# Patient Record
Sex: Female | Born: 2005 | Hispanic: Yes | Marital: Single | Smoking: Never smoker
Health system: Southern US, Community
[De-identification: ages and names within clinical notes are randomized; demographics above are authoritative.]

## PROBLEM LIST (undated history)

## (undated) DIAGNOSIS — R7303 Prediabetes: Secondary | ICD-10-CM

## (undated) DIAGNOSIS — E785 Hyperlipidemia, unspecified: Secondary | ICD-10-CM

## (undated) DIAGNOSIS — T7840XA Allergy, unspecified, initial encounter: Secondary | ICD-10-CM

## (undated) DIAGNOSIS — H539 Unspecified visual disturbance: Secondary | ICD-10-CM

## (undated) DIAGNOSIS — R7989 Other specified abnormal findings of blood chemistry: Secondary | ICD-10-CM

## (undated) HISTORY — DX: Allergy, unspecified, initial encounter: T78.40XA

## (undated) HISTORY — DX: Hyperlipidemia, unspecified: E78.5

## (undated) HISTORY — DX: Prediabetes: R73.03

## (undated) HISTORY — DX: Other specified abnormal findings of blood chemistry: R79.89

## (undated) HISTORY — DX: Unspecified visual disturbance: H53.9

---
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
## 2024-01-26 DIAGNOSIS — Z7187 Encounter for pediatric-to-adult transition counseling: Secondary | ICD-10-CM | POA: Insufficient documentation

## 2024-01-26 NOTE — Progress Notes (Deleted)
 Pediatric Endocrinology Consultation Follow-up Visit Nichole Alvarado [birth year] [id number], [facility]   HPI: Nichole Alvarado  is a 18 y.o. female presenting for follow-up of {Diagnosis:29534}.  she is accompanied to this visit by her {family members:20773}. {Interpreter present throughout the visit:29436::"No"}.  Nichole Alvarado was last seen at [facility] on 10/13/2023.  Since last visit, gynecology reached out to schedule appointment, but did not return call, so referral was closed. Appt to family medicine was not completed either.   ROS: Greater than 10 systems reviewed with pertinent positives listed in HPI, otherwise neg. The following portions of the patient's history were reviewed and updated as appropriate:  Past Medical History:  has a past medical history of Allergy, Hyperlipidemia, Low vitamin D level, Prediabetes, Prediabetes, and Vision abnormalities.  Meds: Current Outpatient Medications  Medication Instructions   cetirizine  (ZYRTEC ) 10 mg, Oral, Daily   metFORMIN  (GLUCOPHAGE -XR) 500 mg, Oral, Daily with supper   VITAMIN D PO 1 capsule, Daily    Allergies: Allergies  Allergen Reactions   Bee Pollen Itching    Runny nose, itchy eyes    Surgical History: No past surgical history on file.  Family History: family history includes Diabetes in her maternal grandmother; Drug abuse in her brother; GER disease in her mother; Hypertension in her maternal grandmother and mother.  Social History: Social History   Social History Narrative   In 12th grade at [employer] 24-25 school year      Lives mom, dad, brother, and sister. No pets   Likes to draw     reports that she has never smoked. She has never been exposed to tobacco smoke. She has never used smokeless tobacco. She reports that she does not drink alcohol.  Physical Exam:  There were no vitals filed for this visit. There were no vitals taken for this visit. Body mass index: body mass index is unknown  because there is no height or weight on file. Blood pressure %iles are not available for patients who are 18 years or older. No height and weight on file for this encounter.  Wt Readings from Last 3 Encounters:  10/13/23 198 lb (89.8 kg) (97%, Z= 1.94)*  07/29/23 199 lb (90.3 kg) (98%, Z= 1.96)*  01/28/23 (!) 205 lb 12.8 oz (93.4 kg) (98%, Z= 2.06)*   * Growth percentiles are based on CDC (Girls, 2-20 Years) data.   Ht Readings from Last 3 Encounters:  10/13/23 5' 5.91" (1.674 m) (74%, Z= 0.65)*  07/29/23 5' 5.87" (1.673 m) (74%, Z= 0.64)*  01/28/23 5' 6.3" (1.684 m) (79%, Z= 0.82)*   * Growth percentiles are based on CDC (Girls, 2-20 Years) data.   Physical Exam   Labs: Results for orders placed or performed in visit on 07/29/23  T4, free   Collection Time: 07/29/23 12:51 PM  Result Value Ref Range   Free T4 1.3 0.8 - 1.4 ng/dL  TSH   Collection Time: 07/29/23 12:51 PM  Result Value Ref Range   TSH 1.79 mIU/L  CBC with Differential/Platelet   Collection Time: 07/29/23 12:51 PM  Result Value Ref Range   WBC 6.2 4.5 - 13.0 Thousand/uL   RBC 4.39 3.80 - 5.10 Million/uL   Hemoglobin 12.7 11.5 - 15.3 g/dL   HCT 62.1 30.8 - 65.7 %   MCV 90.0 78.0 - 98.0 fL   MCH 28.9 25.0 - 35.0 pg   MCHC 32.2 31.0 - 36.0 g/dL   RDW 84.6 96.2 - 95.2 %   Platelets 183  140 - 400 Thousand/uL   MPV 12.6 (H) 7.5 - 12.5 fL   Neutro Abs 3,422 1,800 - 8,000 cells/uL   Absolute Lymphocytes 2,294 1,200 - 5,200 cells/uL   Absolute Monocytes 428 200 - 900 cells/uL   Eosinophils Absolute 37 15 - 500 cells/uL   Basophils Absolute 19 0 - 200 cells/uL   Neutrophils Relative % 55.2 %   Total Lymphocyte 37.0 %   Monocytes Relative 6.9 %   Eosinophils Relative 0.6 %   Basophils Relative 0.3 %  Fe+TIBC+Fer   Collection Time: 07/29/23 12:51 PM  Result Value Ref Range   Iron 71 27 - 164 mcg/dL   TIBC 161 096 - 045 mcg/dL (calc)   %SAT 20 15 - 45 % (calc)   Ferritin 28 6 - 67 ng/mL  DHEA-sulfate    Collection Time: 07/29/23 12:51 PM  Result Value Ref Range   DHEA-SO4 175 44 - 286 mcg/dL  Testosterone , free   Collection Time: 07/29/23 12:51 PM  Result Value Ref Range   TESTOSTERONE  FREE 10.2 (H) 0.2 - 5.0 pg/mL  [facility], Pediatrics   Collection Time: 07/29/23 12:51 PM  Result Value Ref Range   FSH, Pediatrics 5.10 mIU/mL  LH, Pediatrics   Collection Time: 07/29/23 12:51 PM  Result Value Ref Range   LH, Pediatrics 7.12 mIU/mL  Estradiol , Ultra Sens   Collection Time: 07/29/23 12:51 PM  Result Value Ref Range   Estradiol , Ultra Sensitive 52 pg/mL  Comprehensive metabolic panel   Collection Time: 07/29/23 12:51 PM  Result Value Ref Range   Glucose, Bld 77 65 - 99 mg/dL   BUN 10 7 - 20 mg/dL   Creat 4.09 8.11 - 9.14 mg/dL   BUN/Creatinine Ratio SEE NOTE: 6 - 22 (calc)   Sodium 137 135 - 146 mmol/L   Potassium 4.3 3.8 - 5.1 mmol/L   Chloride 103 98 - 110 mmol/L   CO2 27 20 - 32 mmol/L   Calcium 9.1 8.9 - 10.4 mg/dL   Total Protein 7.2 6.3 - 8.2 g/dL   Albumin 4.1 3.6 - 5.1 g/dL   Globulin 3.1 2.0 - 3.8 g/dL (calc)   AG Ratio 1.3 1.0 - 2.5 (calc)   Total Bilirubin 0.5 0.2 - 1.1 mg/dL   Alkaline phosphatase (APISO) 75 36 - 128 U/L   AST 16 12 - 32 U/L   ALT 21 5 - 32 U/L  Lipid panel   Collection Time: 07/29/23 12:51 PM  Result Value Ref Range   Cholesterol 157 <170 mg/dL   HDL 45 (L) >78 mg/dL   Triglycerides 295 (H) <90 mg/dL   LDL Cholesterol (Calc) 87 <621 mg/dL (calc)   Total CHOL/HDL Ratio 3.5 <5.0 (calc)   Non-HDL Cholesterol (Calc) 112 <120 mg/dL (calc)  Hemoglobin H0Q   Collection Time: 07/29/23 12:51 PM  Result Value Ref Range   Hgb A1c MFr Bld 5.6 <5.7 % of total Hgb   Mean Plasma Glucose 114 mg/dL   eAG (mmol/L) 6.3 mmol/L    Imaging: No results found for this or any previous visit.   Assessment/Plan: PCOS (polycystic ovarian syndrome) Overview: History of metabolic syndrome and oligomenorrhea with menometrorrhagia and hirsutism on exam. Screening  studies confirmed diagnosis of PCOS as free testosterone  was elevated at 10.    Thyroid  nodule Overview: Per Dr. Erasmo Hatch documentation: - Ultrasound with 1.5 cm nodule in March 2023 - Uptake scan did not show nodule in May 2023 - Thyroid  US  showed only pseudonodularity in 2024 - Biopsy ordered but  not scheduled/cancelled April 2024 thyroid  ultrasound: per radiologist, Nonspecific thyroid  heterogeneity and background pseudo nodularity. Thyroid  abs neg 2023     There are no Patient Instructions on file for this visit.  Follow-up:   No follow-ups on file.  Medical decision-making:  I have personally spent *** minutes involved in face-to-face and non-face-to-face activities for this patient on the day of the visit. Professional time spent includes the following activities, in addition to those noted in the documentation: preparation time/chart review, ordering of medications/tests/procedures, obtaining and/or reviewing separately obtained history, counseling and educating the patient/family/caregiver, performing a medically appropriate examination and/or evaluation, referring and communicating with other health care professionals for care coordination, my interpretation of the bone age***, and documentation in the EHR.  Thank you for the opportunity to participate in the care of your patient. Please do not hesitate to contact me should you have any questions regarding the assessment or treatment plan.   Sincerely,   Maryjo Snipe, MD

## 2024-01-27 ENCOUNTER — Ambulatory Visit (INDEPENDENT_AMBULATORY_CARE_PROVIDER_SITE_OTHER): Payer: Self-pay | Admitting: Pediatrics

## 2024-01-27 ENCOUNTER — Encounter (INDEPENDENT_AMBULATORY_CARE_PROVIDER_SITE_OTHER): Payer: Self-pay

## 2024-01-27 DIAGNOSIS — E041 Nontoxic single thyroid nodule: Secondary | ICD-10-CM

## 2024-01-27 DIAGNOSIS — E282 Polycystic ovarian syndrome: Secondary | ICD-10-CM

## 2024-01-27 DIAGNOSIS — Z7187 Encounter for pediatric-to-adult transition counseling: Secondary | ICD-10-CM

## 2024-02-16 NOTE — Progress Notes (Deleted)
 Pediatric Endocrinology Consultation Follow-up Visit Nichole Alvarado [birth year] [id number], [facility]   HPI: Nichole Alvarado  is a 18 y.o. female presenting for follow-up of {Diagnosis:29534}.  she is accompanied to this visit by her {family members:20773}. {Interpreter present throughout the visit:29436::No}.  Nichole Alvarado was last seen at [facility] on 10/13/2023.  Since last visit, gynecology called to schedule appointment and LVM with no response, so referral was closed.  ROS: Greater than 10 systems reviewed with pertinent positives listed in HPI, otherwise neg. The following portions of the patient's history were reviewed and updated as appropriate:  Past Medical History:  has a past medical history of Allergy, Hyperlipidemia, Low vitamin D level, Prediabetes, Prediabetes, and Vision abnormalities.  Meds: Current Outpatient Medications  Medication Instructions   cetirizine  (ZYRTEC ) 10 mg, Oral, Daily   metFORMIN  (GLUCOPHAGE -XR) 500 mg, Oral, Daily with supper   VITAMIN D PO 1 capsule, Daily    Allergies: Allergies  Allergen Reactions   Bee Pollen Itching    Runny nose, itchy eyes    Surgical History: No past surgical history on file.  Family History: family history includes Diabetes in her maternal grandmother; Drug abuse in her brother; GER disease in her mother; Hypertension in her maternal grandmother and mother.  Social History: Social History   Social History Narrative   In 12th grade at [employer] 24-25 school year      Lives mom, dad, brother, and sister. No pets   Likes to draw     reports that she has never smoked. She has never been exposed to tobacco smoke. She has never used smokeless tobacco. She reports that she does not drink alcohol.  Physical Exam:  There were no vitals filed for this visit. There were no vitals taken for this visit. Body mass index: body mass index is unknown because there is no height or weight on file. Blood  pressure %iles are not available for patients who are 18 years or older. No height and weight on file for this encounter.  Wt Readings from Last 3 Encounters:  10/13/23 198 lb (89.8 kg) (97%, Z= 1.94)*  07/29/23 199 lb (90.3 kg) (98%, Z= 1.96)*  01/28/23 (!) 205 lb 12.8 oz (93.4 kg) (98%, Z= 2.06)*   * Growth percentiles are based on CDC (Girls, 2-20 Years) data.   Ht Readings from Last 3 Encounters:  10/13/23 5' 5.91 (1.674 m) (74%, Z= 0.65)*  07/29/23 5' 5.87 (1.673 m) (74%, Z= 0.64)*  01/28/23 5' 6.3 (1.684 m) (79%, Z= 0.82)*   * Growth percentiles are based on CDC (Girls, 2-20 Years) data.   Physical Exam   Labs: Results for orders placed or performed in visit on 07/29/23  T4, free   Collection Time: 07/29/23 12:51 PM  Result Value Ref Range   Free T4 1.3 0.8 - 1.4 ng/dL  TSH   Collection Time: 07/29/23 12:51 PM  Result Value Ref Range   TSH 1.79 mIU/L  CBC with Differential/Platelet   Collection Time: 07/29/23 12:51 PM  Result Value Ref Range   WBC 6.2 4.5 - 13.0 Thousand/uL   RBC 4.39 3.80 - 5.10 Million/uL   Hemoglobin 12.7 11.5 - 15.3 g/dL   HCT 60.4 65.9 - 53.9 %   MCV 90.0 78.0 - 98.0 fL   MCH 28.9 25.0 - 35.0 pg   MCHC 32.2 31.0 - 36.0 g/dL   RDW 87.6 88.9 - 84.9 %   Platelets 183 140 - 400 Thousand/uL   MPV 12.6 (H) 7.5 -  12.5 fL   Neutro Abs 3,422 1,800 - 8,000 cells/uL   Absolute Lymphocytes 2,294 1,200 - 5,200 cells/uL   Absolute Monocytes 428 200 - 900 cells/uL   Eosinophils Absolute 37 15 - 500 cells/uL   Basophils Absolute 19 0 - 200 cells/uL   Neutrophils Relative % 55.2 %   Total Lymphocyte 37.0 %   Monocytes Relative 6.9 %   Eosinophils Relative 0.6 %   Basophils Relative 0.3 %  Fe+TIBC+Fer   Collection Time: 07/29/23 12:51 PM  Result Value Ref Range   Iron 71 27 - 164 mcg/dL   TIBC 650 728 - 551 mcg/dL (calc)   %SAT 20 15 - 45 % (calc)   Ferritin 28 6 - 67 ng/mL  DHEA-sulfate   Collection Time: 07/29/23 12:51 PM  Result Value Ref  Range   DHEA-SO4 175 44 - 286 mcg/dL  Testosterone , free   Collection Time: 07/29/23 12:51 PM  Result Value Ref Range   TESTOSTERONE  FREE 10.2 (H) 0.2 - 5.0 pg/mL  [facility], Pediatrics   Collection Time: 07/29/23 12:51 PM  Result Value Ref Range   FSH, Pediatrics 5.10 mIU/mL  LH, Pediatrics   Collection Time: 07/29/23 12:51 PM  Result Value Ref Range   LH, Pediatrics 7.12 mIU/mL  Estradiol , Ultra Sens   Collection Time: 07/29/23 12:51 PM  Result Value Ref Range   Estradiol , Ultra Sensitive 52 pg/mL  Comprehensive metabolic panel   Collection Time: 07/29/23 12:51 PM  Result Value Ref Range   Glucose, Bld 77 65 - 99 mg/dL   BUN 10 7 - 20 mg/dL   Creat 9.35 9.49 - 9.03 mg/dL   BUN/Creatinine Ratio SEE NOTE: 6 - 22 (calc)   Sodium 137 135 - 146 mmol/L   Potassium 4.3 3.8 - 5.1 mmol/L   Chloride 103 98 - 110 mmol/L   CO2 27 20 - 32 mmol/L   Calcium 9.1 8.9 - 10.4 mg/dL   Total Protein 7.2 6.3 - 8.2 g/dL   Albumin 4.1 3.6 - 5.1 g/dL   Globulin 3.1 2.0 - 3.8 g/dL (calc)   AG Ratio 1.3 1.0 - 2.5 (calc)   Total Bilirubin 0.5 0.2 - 1.1 mg/dL   Alkaline phosphatase (APISO) 75 36 - 128 U/L   AST 16 12 - 32 U/L   ALT 21 5 - 32 U/L  Lipid panel   Collection Time: 07/29/23 12:51 PM  Result Value Ref Range   Cholesterol 157 <170 mg/dL   HDL 45 (L) >54 mg/dL   Triglycerides 851 (H) <90 mg/dL   LDL Cholesterol (Calc) 87 <889 mg/dL (calc)   Total CHOL/HDL Ratio 3.5 <5.0 (calc)   Non-HDL Cholesterol (Calc) 112 <120 mg/dL (calc)  Hemoglobin J8r   Collection Time: 07/29/23 12:51 PM  Result Value Ref Range   Hgb A1c MFr Bld 5.6 <5.7 % of total Hgb   Mean Plasma Glucose 114 mg/dL   eAG (mmol/L) 6.3 mmol/L    Imaging: No results found for this or any previous visit.   Assessment/Plan: PCOS (polycystic ovarian syndrome) Overview: History of metabolic syndrome and oligomenorrhea with menometrorrhagia and hirsutism on exam. Screening studies confirmed diagnosis of PCOS as free  testosterone  was elevated at 10.    Metabolic syndrome Overview: Metabolic syndrome diagnosed as she has class I obesity, insulin resistance, and irregular menses.  Nichole Alvarado established care with [facility] Division of Endocrinology February 2023 with Dr. Dorrene and transitioned care to me 07/29/2023.      There are no Patient  Instructions on file for this visit.  Follow-up:   No follow-ups on file.  Medical decision-making:  I have personally spent *** minutes involved in face-to-face and non-face-to-face activities for this patient on the day of the visit. Professional time spent includes the following activities, in addition to those noted in the documentation: preparation time/chart review, ordering of medications/tests/procedures, obtaining and/or reviewing separately obtained history, counseling and educating the patient/family/caregiver, performing a medically appropriate examination and/or evaluation, referring and communicating with other health care professionals for care coordination, my interpretation of the bone age***, and documentation in the EHR.  Thank you for the opportunity to participate in the care of your patient. Please do not hesitate to contact me should you have any questions regarding the assessment or treatment plan.   Sincerely,   Marce Rucks, MD

## 2024-02-18 ENCOUNTER — Ambulatory Visit (INDEPENDENT_AMBULATORY_CARE_PROVIDER_SITE_OTHER): Payer: Self-pay | Admitting: Pediatrics

## 2024-02-18 DIAGNOSIS — E282 Polycystic ovarian syndrome: Secondary | ICD-10-CM

## 2024-02-18 DIAGNOSIS — Z7187 Encounter for pediatric-to-adult transition counseling: Secondary | ICD-10-CM

## 2024-02-18 DIAGNOSIS — E8881 Metabolic syndrome: Secondary | ICD-10-CM

## 2024-05-01 ENCOUNTER — Ambulatory Visit (INDEPENDENT_AMBULATORY_CARE_PROVIDER_SITE_OTHER): Payer: Self-pay | Admitting: Pediatrics

## 2024-05-08 ENCOUNTER — Ambulatory Visit (INDEPENDENT_AMBULATORY_CARE_PROVIDER_SITE_OTHER): Payer: Self-pay | Admitting: Pediatrics

## 2024-05-16 ENCOUNTER — Encounter (INDEPENDENT_AMBULATORY_CARE_PROVIDER_SITE_OTHER): Payer: Self-pay | Admitting: Pediatrics

## 2024-05-16 ENCOUNTER — Ambulatory Visit (INDEPENDENT_AMBULATORY_CARE_PROVIDER_SITE_OTHER): Payer: Self-pay | Admitting: Pediatrics

## 2024-05-16 VITALS — BP 114/80 | HR 80 | Wt 199.8 lb

## 2024-05-16 DIAGNOSIS — Z7187 Encounter for pediatric-to-adult transition counseling: Secondary | ICD-10-CM

## 2024-05-16 DIAGNOSIS — E8881 Metabolic syndrome: Secondary | ICD-10-CM

## 2024-05-16 DIAGNOSIS — E282 Polycystic ovarian syndrome: Secondary | ICD-10-CM | POA: Diagnosis not present

## 2024-05-16 DIAGNOSIS — Z68.41 Body mass index (BMI) pediatric, greater than or equal to 140% of the 95th percentile for age: Secondary | ICD-10-CM

## 2024-05-16 DIAGNOSIS — E66811 Obesity, class 1: Secondary | ICD-10-CM

## 2024-05-16 MED ORDER — NORETHINDRONE ACET-ETHINYL EST 1-20 MG-MCG PO TABS: 1.0000 | ORAL_TABLET | Freq: Every day | ORAL | 5 refills | Status: DC

## 2024-05-16 NOTE — Assessment & Plan Note (Signed)
-  healthy choices reviewed including making time to walk in nature

## 2024-05-16 NOTE — Patient Instructions (Addendum)
 I will send a Mychart message with your lab results. You can start the hormonal tablets every day and this will help to regulate your periods. I recommend taking it for at least 6 months. Also, please call [facility] gynecology to establish care.

## 2024-05-16 NOTE — Assessment & Plan Note (Signed)
-  secondary amenorrhea persists -fasting labs as below -she did not tolerate metformin , so will start hormonal treatment as requested. We reviewed the risks and benefits of estrogen hormonal treatment, and when to seek out immediate medical care that included shortness of breath, acute headache with alarm symptoms, unexplained localized swelling/tenderness, etc.  -referral to gynecology, she is 20 and ready to transition to adult care

## 2024-05-16 NOTE — Progress Notes (Signed)
 Pediatric Endocrinology Consultation Follow-up Visit Nichole Alvarado 2006, [facility]   HPI: Nichole Alvarado  is a 18 y.o. female presenting for follow-up of Metabolic syndrome and PCOS.  she is accompanied to this visit by her mother and family. Interpreter present throughout the visit: Yes Spanish.  Nichole Alvarado was last seen at [facility] on 10/13/2023.  Since last visit, she has not had menses in the past 6 months. She has not been taking metformin  as she feels that pill is too big and she cannot swallow. They would like to start hormonal treatment.   No personal history of headaches/migraines, bleeding diathesis or clotting disorders, liver disease, and uterine/breast cancer. No family history of thrombosis or bleeding disorders.    I had referred her to gynecology for further management of PCOS and the referral was closed as they called twice and left voicemails with no response. She is embarrassed to see another doctor about this.  ROS: Greater than 10 systems reviewed with pertinent positives listed in HPI, otherwise neg. The following portions of the patient's history were reviewed and updated as appropriate:  Past Medical History:  has a past medical history of Allergy, Hyperlipidemia, Low vitamin D level, Prediabetes, Prediabetes, and Vision abnormalities.  Meds: Current Outpatient Medications  Medication Instructions   cetirizine  (ZYRTEC ) 10 mg, Oral, Daily   norethindrone -ethinyl estradiol  (LOESTRIN 1/20, 21,) 1-20 MG-MCG tablet 1 tablet, Oral, Daily   VITAMIN D PO 1 capsule, Daily    Allergies: Allergies  Allergen Reactions   Bee Pollen Itching    Runny nose, itchy eyes    Surgical History: History reviewed. No pertinent surgical history.  Family History: family history includes Diabetes in her maternal grandmother; Drug abuse in her brother; GER disease in her mother; Hypertension in her maternal grandmother and mother.  Social  History: Social History   Social History Narrative   Graduated 2025   Attends [employer]   Lives mom, dad, brother, and sister.    No pets   Likes to draw     reports that she has never smoked. She has never been exposed to tobacco smoke. She has never used smokeless tobacco. She reports that she does not drink alcohol.  Physical Exam:  Vitals:   05/16/24 1416  BP: 114/80  Pulse: 80  Weight: 199 lb 12.8 oz (90.6 kg)   BP 114/80 (BP Location: Right Arm, Patient Position: Sitting, Cuff Size: Large)   Pulse 80   Wt 199 lb 12.8 oz (90.6 kg)   BMI 32.34 kg/m  Body mass index: body mass index is 32.34 kg/m. Blood pressure %iles are not available for patients who are 18 years or older. 96 %ile (Z= 1.72, 104% of 95%ile) based on CDC (Girls, 2-20 Years) BMI-for-age data using weight from 05/16/2024 and height from 10/13/2023.  Wt Readings from Last 3 Encounters:  05/16/24 199 lb 12.8 oz (90.6 kg) (97%, Z= 1.95)*  10/13/23 198 lb (89.8 kg) (97%, Z= 1.94)*  07/29/23 199 lb (90.3 kg) (98%, Z= 1.96)*   * Growth percentiles are based on CDC (Girls, 2-20 Years) data.   Ht Readings from Last 3 Encounters:  10/13/23 5' 5.91 (1.674 m) (74%, Z= 0.65)*  07/29/23 5' 5.87 (1.673 m) (74%, Z= 0.64)*  01/28/23 5' 6.3 (1.684 m) (79%, Z= 0.82)*   * Growth percentiles are based on CDC (Girls, 2-20 Years) data.   Physical Exam Vitals reviewed.  Constitutional:      Appearance: Normal appearance. She is not toxic-appearing.  HENT:  Head: Normocephalic and atraumatic.     Nose: Nose normal.     Mouth/Throat:     Mouth: Mucous membranes are moist.  Eyes:     Extraocular Movements: Extraocular movements intact.     Comments: glasses  Pulmonary:     Effort: Pulmonary effort is normal. No respiratory distress.  Abdominal:     General: There is no distension.  Musculoskeletal:        General: Normal range of motion.     Cervical back: Normal range of motion and neck supple.  Skin:    General:  Skin is warm.     Comments: FG 10, mild facial acne  Neurological:     General: No focal deficit present.     Mental Status: She is alert.     Gait: Gait normal.  Psychiatric:        Mood and Affect: Mood normal.        Behavior: Behavior normal.      Labs: Results for orders placed or performed in visit on 07/29/23  T4, free   Collection Time: 07/29/23 12:51 PM  Result Value Ref Range   Free T4 1.3 0.8 - 1.4 ng/dL  TSH   Collection Time: 07/29/23 12:51 PM  Result Value Ref Range   TSH 1.79 mIU/L  CBC with Differential/Platelet   Collection Time: 07/29/23 12:51 PM  Result Value Ref Range   WBC 6.2 4.5 - 13.0 Thousand/uL   RBC 4.39 3.80 - 5.10 Million/uL   Hemoglobin 12.7 11.5 - 15.3 g/dL   HCT 60.4 65.9 - 53.9 %   MCV 90.0 78.0 - 98.0 fL   MCH 28.9 25.0 - 35.0 pg   MCHC 32.2 31.0 - 36.0 g/dL   RDW 87.6 88.9 - 84.9 %   Platelets 183 140 - 400 Thousand/uL   MPV 12.6 (H) 7.5 - 12.5 fL   Neutro Abs 3,422 1,800 - 8,000 cells/uL   Absolute Lymphocytes 2,294 1,200 - 5,200 cells/uL   Absolute Monocytes 428 200 - 900 cells/uL   Eosinophils Absolute 37 15 - 500 cells/uL   Basophils Absolute 19 0 - 200 cells/uL   Neutrophils Relative % 55.2 %   Total Lymphocyte 37.0 %   Monocytes Relative 6.9 %   Eosinophils Relative 0.6 %   Basophils Relative 0.3 %  Fe+TIBC+Fer   Collection Time: 07/29/23 12:51 PM  Result Value Ref Range   Iron 71 27 - 164 mcg/dL   TIBC 650 728 - 551 mcg/dL (calc)   %SAT 20 15 - 45 % (calc)   Ferritin 28 6 - 67 ng/mL  DHEA-sulfate   Collection Time: 07/29/23 12:51 PM  Result Value Ref Range   DHEA-SO4 175 44 - 286 mcg/dL  Testosterone , free   Collection Time: 07/29/23 12:51 PM  Result Value Ref Range   TESTOSTERONE  FREE 10.2 (H) 0.2 - 5.0 pg/mL  [facility], Pediatrics   Collection Time: 07/29/23 12:51 PM  Result Value Ref Range   FSH, Pediatrics 5.10 mIU/mL  LH, Pediatrics   Collection Time: 07/29/23 12:51 PM  Result Value Ref Range   LH, Pediatrics  7.12 mIU/mL  Estradiol , Ultra Sens   Collection Time: 07/29/23 12:51 PM  Result Value Ref Range   Estradiol , Ultra Sensitive 52 pg/mL  Comprehensive metabolic panel   Collection Time: 07/29/23 12:51 PM  Result Value Ref Range   Glucose, Bld 77 65 - 99 mg/dL   BUN 10 7 - 20 mg/dL   Creat 9.35 9.49 - 9.03 mg/dL  BUN/Creatinine Ratio SEE NOTE: 6 - 22 (calc)   Sodium 137 135 - 146 mmol/L   Potassium 4.3 3.8 - 5.1 mmol/L   Chloride 103 98 - 110 mmol/L   CO2 27 20 - 32 mmol/L   Calcium 9.1 8.9 - 10.4 mg/dL   Total Protein 7.2 6.3 - 8.2 g/dL   Albumin 4.1 3.6 - 5.1 g/dL   Globulin 3.1 2.0 - 3.8 g/dL (calc)   AG Ratio 1.3 1.0 - 2.5 (calc)   Total Bilirubin 0.5 0.2 - 1.1 mg/dL   Alkaline phosphatase (APISO) 75 36 - 128 U/L   AST 16 12 - 32 U/L   ALT 21 5 - 32 U/L  Lipid panel   Collection Time: 07/29/23 12:51 PM  Result Value Ref Range   Cholesterol 157 <170 mg/dL   HDL 45 (L) >54 mg/dL   Triglycerides 851 (H) <90 mg/dL   LDL Cholesterol (Calc) 87 <889 mg/dL (calc)   Total CHOL/HDL Ratio 3.5 <5.0 (calc)   Non-HDL Cholesterol (Calc) 112 <120 mg/dL (calc)  Hemoglobin J8r   Collection Time: 07/29/23 12:51 PM  Result Value Ref Range   Hgb A1c MFr Bld 5.6 <5.7 % of total Hgb   Mean Plasma Glucose 114 mg/dL   eAG (mmol/L) 6.3 mmol/L    Imaging: No results found for this or any previous visit.   Assessment/Plan: Nichole Alvarado was seen today for pcos (polycystic ovarian syndrome).  Metabolic syndrome Overview: Metabolic syndrome diagnosed as she has class I obesity, insulin resistance, and irregular menses.  Nichole Alvarado established care with [facility] Division of Endocrinology February 2023 with Dr. Dorrene and transitioned care to me 07/29/2023.   Assessment & Plan: -healthy choices reviewed including making time to walk in nature   PCOS (polycystic ovarian syndrome) Overview: History of metabolic syndrome and oligomenorrhea with menometrorrhagia and  hirsutism on exam. Screening studies confirmed diagnosis of PCOS as free testosterone  was elevated at 10. Failed treatment with metformin  due to not tolerating the size of the pill. Hormonal treatment started 05/16/2024.  Assessment & Plan: -secondary amenorrhea persists -fasting labs as below -she did not tolerate metformin , so will start hormonal treatment as requested. We reviewed the risks and benefits of estrogen hormonal treatment, and when to seek out immediate medical care that included shortness of breath, acute headache with alarm symptoms, unexplained localized swelling/tenderness, etc.  -referral to gynecology, she is 18 and ready to transition to adult care  Orders: -     Ambulatory referral to Gynecology -     Testosterone , free -     DHEA-sulfate -     FSH/LH -     Prolactin -     hCG, serum, qualitative -     T4, free -     TSH -     Norethindrone  Acet-Ethinyl Est; Take 1 tablet by mouth daily.  Dispense: 28 tablet; Refill: 5  Counseling for transition from pediatric to adult care provider -     Ambulatory referral to Gynecology -     Testosterone , free -     DHEA-sulfate -     FSH/LH -     Prolactin -     hCG, serum, qualitative -     T4, free -     TSH  Class 1 obesity due to excess calories without serious comorbidity with body mass index (BMI) of 32.0 to 32.9 in adult    Patient Instructions  I will send a Mychart message with your lab results. You  can start the hormonal tablets every day and this will help to regulate your periods. I recommend taking it for at least 6 months. Also, please call [facility] gynecology to establish care.    Follow-up:   Return for Transitioning to adult care with gynecology. .  Medical decision-making:  I have personally spent 42 minutes involved in face-to-face and non-face-to-face activities for this patient on the day of the visit. Professional time spent includes the following activities, in addition to those noted in the  documentation: preparation time/chart review, ordering of medications/tests/procedures, obtaining and/or reviewing separately obtained history, counseling and educating the patient/family/caregiver, performing a medically appropriate examination and/or evaluation, referring and communicating with other health care professionals for care coordination, and documentation in the EHR.  Thank you for the opportunity to participate in the care of your patient. Please do not hesitate to contact me should you have any questions regarding the assessment or treatment plan.   Sincerely,   Marce Rucks, MD

## 2024-05-17 LAB — T4, FREE: Free T4: 1.3 ng/dL (ref 0.8–1.4)

## 2024-05-17 LAB — HCG, SERUM, QUALITATIVE: Preg, Serum: NEGATIVE

## 2024-05-17 LAB — FSH/LH
FSH: 6.7 m[IU]/mL
LH: 15.2 m[IU]/mL

## 2024-05-17 LAB — DHEA-SULFATE: DHEA-SO4: 160 ug/dL (ref 44–286)

## 2024-05-17 LAB — TSH: TSH: 1.38 m[IU]/L

## 2024-05-17 LAB — PROLACTIN: Prolactin: 8.4 ng/mL

## 2024-05-29 ENCOUNTER — Telehealth (INDEPENDENT_AMBULATORY_CARE_PROVIDER_SITE_OTHER): Payer: Self-pay | Admitting: Pediatrics

## 2024-05-29 DIAGNOSIS — E282 Polycystic ovarian syndrome: Secondary | ICD-10-CM

## 2024-05-29 MED ORDER — NORETHINDRONE ACET-ETHINYL EST 1-20 MG-MCG PO TABS: 1.0000 | ORAL_TABLET | Freq: Every day | ORAL | 5 refills | Status: AC

## 2024-05-29 NOTE — Telephone Encounter (Signed)
 Returned a call to the pharmacy concerning norethindrone -ethinyl estradiol .  The pharmacy would like to know if the MD would like the patient to take the iron pills or not. If the patient is to take the iron pills 28 days is correct and  if not the prescription need to indicate 21 days. Will discuss with the provider and get back to the pharmacy.

## 2024-05-29 NOTE — Telephone Encounter (Signed)
 Confirmed 21 day Rx as previously prescribed.

## 2024-05-29 NOTE — Telephone Encounter (Signed)
   norethindrone -ethinyl estradiol  (LOESTRIN 1/20, 21,) 1-20 MG-MCG tablet   The pharmacist called wanting to speak to someone regarding the medication above.

## 2024-06-02 ENCOUNTER — Ambulatory Visit (INDEPENDENT_AMBULATORY_CARE_PROVIDER_SITE_OTHER): Payer: Self-pay | Admitting: Pediatrics

## 2024-06-02 NOTE — Progress Notes (Signed)
 Good day, the labs are normal, though the testosterone  level was not drawn and sent for an unknown reason. This is reassuring. No endocrine follow up recommended. Please follow up with your gynecologist since you are 18 years old. Good luck. Dr. CHRISTELLA
